# Patient Record
Sex: Male | Born: 1995 | Race: Black or African American | Hispanic: No | Marital: Single | Smoking: Never smoker
Health system: Southern US, Community
[De-identification: ages and names within clinical notes are randomized; demographics above are authoritative.]

## PROBLEM LIST (undated history)

## (undated) DIAGNOSIS — R011 Cardiac murmur, unspecified: Secondary | ICD-10-CM

## (undated) DIAGNOSIS — J45909 Unspecified asthma, uncomplicated: Secondary | ICD-10-CM

---
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
## 2018-05-14 ENCOUNTER — Emergency Department (HOSPITAL_COMMUNITY)
Admission: EM | Admit: 2018-05-14 | Discharge: 2018-05-14 | Disposition: A | Discharge status: Home / Self Care | Payer: Self-pay | Attending: Emergency Medicine | Admitting: Emergency Medicine

## 2018-05-14 ENCOUNTER — Emergency Department (HOSPITAL_COMMUNITY): Payer: Self-pay

## 2018-05-14 DIAGNOSIS — Y9389 Activity, other specified: Secondary | ICD-10-CM | POA: Insufficient documentation

## 2018-05-14 DIAGNOSIS — S82891A Other fracture of right lower leg, initial encounter for closed fracture: Secondary | ICD-10-CM | POA: Insufficient documentation

## 2018-05-14 DIAGNOSIS — Y929 Unspecified place or not applicable: Secondary | ICD-10-CM | POA: Insufficient documentation

## 2018-05-14 DIAGNOSIS — S82831A Other fracture of upper and lower end of right fibula, initial encounter for closed fracture: Secondary | ICD-10-CM | POA: Insufficient documentation

## 2018-05-14 DIAGNOSIS — Y999 Unspecified external cause status: Secondary | ICD-10-CM | POA: Insufficient documentation

## 2018-05-14 MED ORDER — HYDROCODONE-ACETAMINOPHEN 5-325 MG PO TABS: 1.0000 | ORAL_TABLET | ORAL | 0 refills | Status: DC | PRN

## 2018-05-14 MED ORDER — HYDROCODONE-ACETAMINOPHEN 5-325 MG PO TABS
1.0000 | ORAL_TABLET | Freq: Once | ORAL | Status: AC
  Administered 2018-05-14: 1 via ORAL
  Filled 2018-05-14: qty 1
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
## 2018-05-14 NOTE — Discharge Instructions (Signed)
Please return to the Emergency Department for any new or worsening symptoms or if your symptoms do not improve. Please be sure to follow up with your Primary Care Physician as soon as possible regarding your visit today. If you do not have a Primary Doctor please use the resources below to establish one. X-ray today showed a fracture of your proximal fibula as well as possible fracture of your right ankle.  Please use the cam walker to support the area, please use crutches and avoid weightbearing or activities that increase pain to your right lower leg.  Please follow-up with the orthopedic specialist, Dr. Magnus Ivan next week for further evaluation of your fractures. You may use the short course of pain medication as needed.  Do not drive or operate machinery while taking these medications because they will make you drowsy. You may also use ice, rest, elevation to help with your pain.  You may also use over-the-counter anti-inflammatories such as ibuprofen as directed on the packaging.  Contact a health care provider if: Your pain is becoming worse rather than better or is not controlled with medicines. You have increased swelling or redness in the foot. You begin to lose feeling in your foot or toes. Get help right away if: You develop a cold or blue foot or toes on the injured side. You develop severe pain in your injured leg, especially if the pain is increased with movement of your toes. Get help right away if: Your pain is getting worse. The injured area tingles, becomes numb, or turns cold and blue. The part of your body above or below the cast is swollen and discolored. You cannot feel or move your fingers or toes. There is fluid leaking through the cast. You have severe pain or pressure under the cast. You have trouble breathing. You have shortness of breath. You have chest pain.  Do not take your medicine if  develop an itchy rash, swelling in your mouth or lips, or difficulty  breathing.   RESOURCE GUIDE  Chronic Pain Problems: Contact [facility]  [phone] Patients need to be referred by their primary care doctor.  Insufficient Money for Medicine: Contact [employer]:  call "211" or [facility] [phone].  No Primary Care Doctor: Call [facility]  [phone] - can help you locate a primary care doctor that  accepts your insurance, provides certain services, etc. Physician Referral [phone]  Agencies that provide inexpensive medical care: [facility]  [phone] [facility]  [phone] [facility]  [phone] [facility]  [phone] Planned [facility]  [phone] [facility]  [phone]  Medicaid-accepting [facility] Providers: [facility]- [address]  [phone], Mon-Fri 9am-7pm, Sat 9am-1pm [facility]- [address] [place], Suite [place]  [phone] [facility]- [address], [address] [place]  [phone] [facility]- [address]  [phone] Renaye Rakers- [address] [place], [address], [phone]  Only accepts [place] patients after they have their name  applied to their card  Self Pay (no insurance) in [facility]: Sickle Cell Patients: Dr Willey Blade, [facility]  [address] [place], [phone] [facility]- [address] [place]  [phone]       [facility] [place]- [address]       -  [facility]- see information above (Speak to [employer] if you do not have insurance)       -  [facility]- [place], [place],  [place], [place]  Dr Vista Lawman-  [address], [place], [facility]- [address]  [phone]       -  [facility]- [address], [place], also [address] [phone]       -    [facility]- [address], [place], 1st & 3rd Saturday   every month, 10am-1pm  1) Find a Doctor and Pay Out of Pocket Although you won't have to find out who is covered by your insurance plan, it is a good idea to ask around and get recommendations. You will then need to call the office and see if the doctor you have chosen will accept you as a new patient and what types of options they offer for patients who are self-pay. Some doctors offer discounts or will set up payment plans for their patients who do not have insurance, but you will need to ask so you aren't surprised when you get to your appointment.  2) Contact Your Local Health Department Not all health departments have doctors that can see patients for sick visits, but many do, so it is worth a call to see if yours does. If you don't know where your local health department is, you can check in your phone book. The CDC also has a tool to help you locate your state's health department, and many state websites also have listings of all of their local health departments.  3) Find a [place] [facility] If your illness is not likely to be very severe or complicated, you may want to try a walk in clinic. These are popping up all over the country in pharmacies, drugstores, and shopping centers. They're usually staffed by nurse practitioners or physician assistants that have been trained to treat common illnesses and complaints. They're usually fairly quick and inexpensive. However, if you have serious medical issues or chronic medical problems, these are probably not your best option  [facility] [place], [facility], [address], [place], phone [phone] or [phone].  Monday - Friday, call for an appointment. [place], [facility], [address], [place], phone [phone] or [phone].  Monday - Friday, call for an appointment.  Abuse/Neglect: [place] [phone] [place] [phone] (After Hours)  Emergency Shelter:  [facility] [phone]  Maternity Homes: [facility] [place] [phone] [place] [phone]  MRSA Hotline #:   315-552-1754  [facility] Dept. [address]         [place]  Sela Hua Phone:  [phone]                                  Phone:  [phone]                   Phone:  [place], [place]- [phone]       -     [facility] in [place], [address],                                  [phone], [place] [phone] or [phone] (After Hours)   [place]  Substance Abuse Resources: Alcohol and Drug Services  ([phone] [place] [phone] [facility] P[facility] [phone] Residential & Outpatient Substance Abuse Program  4148536109  Psychological Services: [place]  [phone] [place]  [place], [place]. [address] [place] [address] [place], [place]: [phone] or [phone], PicCapture.uy  Dental Assistance  If unable to pay or uninsured, contact:  [facility] or [facility]. to become qualified for the adult dental clinic.  Patients with Medicaid: [facility] [phone] [address], [place] [address] [phone]  If unable to pay, or uninsured, contact [facility] (669)873-6614) or [place] [phone] in [place], [place] in [facility]) to become qualified for the adult dental clinic   Other [facility]- [place], [place], [place], [address], [place], [place], 2nd and 4th Thursday of the month at 6:30am.  10 clients each day by appointment, can sometimes see walk-in patients if someone does not show for an appointment. [facility]- [address] Hillard Danker [place], [place], [address], [place], [place], [place], [address], [place] Department- [phone] [place] [facility] [phone]

## 2018-05-14 NOTE — ED Notes (Signed)
Bed: WTR8 Expected date:  Expected time:  Means of arrival:  Comments: 

## 2018-05-14 NOTE — ED Notes (Signed)
Ortho notified

## 2018-05-20 ENCOUNTER — Ambulatory Visit (INDEPENDENT_AMBULATORY_CARE_PROVIDER_SITE_OTHER): Payer: Self-pay | Admitting: Physician Assistant

## 2018-05-20 ENCOUNTER — Encounter (INDEPENDENT_AMBULATORY_CARE_PROVIDER_SITE_OTHER): Payer: Self-pay | Admitting: Physician Assistant

## 2018-05-20 ENCOUNTER — Ambulatory Visit (INDEPENDENT_AMBULATORY_CARE_PROVIDER_SITE_OTHER): Payer: Self-pay

## 2018-05-20 DIAGNOSIS — M25571 Pain in right ankle and joints of right foot: Secondary | ICD-10-CM

## 2018-05-20 DIAGNOSIS — S93431A Sprain of tibiofibular ligament of right ankle, initial encounter: Secondary | ICD-10-CM

## 2018-05-20 MED ORDER — HYDROCODONE-ACETAMINOPHEN 5-325 MG PO TABS: 1.0000 | ORAL_TABLET | ORAL | 0 refills | Status: AC | PRN
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
## 2018-05-21 ENCOUNTER — Encounter (HOSPITAL_BASED_OUTPATIENT_CLINIC_OR_DEPARTMENT_OTHER): Payer: Self-pay | Admitting: *Deleted

## 2018-05-27 ENCOUNTER — Encounter (HOSPITAL_BASED_OUTPATIENT_CLINIC_OR_DEPARTMENT_OTHER)
Admission: RE | Disposition: A | Discharge status: Home / Self Care | Payer: Self-pay | Source: Ambulatory Visit | Attending: Orthopaedic Surgery

## 2018-05-27 ENCOUNTER — Other Ambulatory Visit: Payer: Self-pay

## 2018-05-27 ENCOUNTER — Ambulatory Visit (HOSPITAL_BASED_OUTPATIENT_CLINIC_OR_DEPARTMENT_OTHER)
Admission: RE | Admit: 2018-05-27 | Discharge: 2018-05-27 | Disposition: A | Discharge status: Home / Self Care | Payer: Self-pay | Source: Ambulatory Visit | Attending: Orthopaedic Surgery | Admitting: Orthopaedic Surgery

## 2018-05-27 ENCOUNTER — Encounter (HOSPITAL_BASED_OUTPATIENT_CLINIC_OR_DEPARTMENT_OTHER): Payer: Self-pay | Admitting: Anesthesiology

## 2018-05-27 ENCOUNTER — Ambulatory Visit (HOSPITAL_BASED_OUTPATIENT_CLINIC_OR_DEPARTMENT_OTHER): Payer: Self-pay | Admitting: Anesthesiology

## 2018-05-27 ENCOUNTER — Ambulatory Visit (HOSPITAL_COMMUNITY): Payer: Self-pay

## 2018-05-27 DIAGNOSIS — S93431A Sprain of tibiofibular ligament of right ankle, initial encounter: Secondary | ICD-10-CM | POA: Insufficient documentation

## 2018-05-27 DIAGNOSIS — Z419 Encounter for procedure for purposes other than remedying health state, unspecified: Secondary | ICD-10-CM

## 2018-05-27 DIAGNOSIS — Y929 Unspecified place or not applicable: Secondary | ICD-10-CM | POA: Insufficient documentation

## 2018-05-27 DIAGNOSIS — S82861A Displaced Maisonneuve's fracture of right leg, initial encounter for closed fracture: Secondary | ICD-10-CM | POA: Diagnosis present

## 2018-05-27 DIAGNOSIS — S82821A Torus fracture of lower end of right fibula, initial encounter for closed fracture: Secondary | ICD-10-CM

## 2018-05-27 DIAGNOSIS — Z880 Allergy status to penicillin: Secondary | ICD-10-CM | POA: Insufficient documentation

## 2018-05-27 DIAGNOSIS — X58XXXA Exposure to other specified factors, initial encounter: Secondary | ICD-10-CM | POA: Insufficient documentation

## 2018-05-27 HISTORY — DX: Cardiac murmur, unspecified: R01.1

## 2018-05-27 HISTORY — PX: ORIF ANKLE FRACTURE: SHX5408

## 2018-05-27 SURGERY — OPEN REDUCTION INTERNAL FIXATION (ORIF) ANKLE FRACTURE
Anesthesia: General | Site: Ankle | Laterality: Right

## 2018-05-27 MED ORDER — BUPIVACAINE HCL (PF) 0.25 % IJ SOLN
INTRAMUSCULAR | Status: AC
  Filled 2018-05-27: qty 30

## 2018-05-27 MED ORDER — LIDOCAINE 2% (20 MG/ML) 5 ML SYRINGE
INTRAMUSCULAR | Status: DC | PRN
  Administered 2018-05-27: 100 mg via INTRAVENOUS

## 2018-05-27 MED ORDER — OXYCODONE HCL 5 MG PO TABS: 5.0000 mg | ORAL_TABLET | Freq: Once | ORAL | Status: DC | PRN

## 2018-05-27 MED ORDER — CLONIDINE HCL (ANALGESIA) 100 MCG/ML EP SOLN
EPIDURAL | Status: DC | PRN
  Administered 2018-05-27: 30 ug
  Administered 2018-05-27: 50 ug

## 2018-05-27 MED ORDER — BUPIVACAINE HCL (PF) 0.5 % IJ SOLN
INTRAMUSCULAR | Status: AC
  Filled 2018-05-27: qty 60

## 2018-05-27 MED ORDER — LACTATED RINGERS IV SOLN
INTRAVENOUS | Status: DC
  Administered 2018-05-27: 08:00:00 via INTRAVENOUS

## 2018-05-27 MED ORDER — LIDOCAINE 2% (20 MG/ML) 5 ML SYRINGE
INTRAMUSCULAR | Status: AC
  Filled 2018-05-27: qty 5

## 2018-05-27 MED ORDER — CLINDAMYCIN PHOSPHATE 900 MG/50ML IV SOLN
INTRAVENOUS | Status: AC
  Filled 2018-05-27: qty 50

## 2018-05-27 MED ORDER — CHLORHEXIDINE GLUCONATE 4 % EX LIQD: 60.0000 mL | Freq: Once | CUTANEOUS | Status: DC

## 2018-05-27 MED ORDER — CLINDAMYCIN PHOSPHATE 900 MG/50ML IV SOLN
900.0000 mg | INTRAVENOUS | Status: AC
  Administered 2018-05-27: 900 mg via INTRAVENOUS

## 2018-05-27 MED ORDER — PROPOFOL 10 MG/ML IV BOLUS
INTRAVENOUS | Status: DC | PRN
  Administered 2018-05-27: 200 mg via INTRAVENOUS

## 2018-05-27 MED ORDER — MIDAZOLAM HCL 2 MG/2ML IJ SOLN
INTRAMUSCULAR | Status: AC
  Filled 2018-05-27: qty 2

## 2018-05-27 MED ORDER — LIDOCAINE HCL (PF) 1 % IJ SOLN
INTRAMUSCULAR | Status: AC
  Filled 2018-05-27: qty 60

## 2018-05-27 MED ORDER — OXYCODONE-ACETAMINOPHEN 5-325 MG PO TABS: 1.0000 | ORAL_TABLET | ORAL | 0 refills | Status: AC | PRN

## 2018-05-27 MED ORDER — LACTATED RINGERS IV SOLN: INTRAVENOUS | Status: DC

## 2018-05-27 MED ORDER — PROPOFOL 500 MG/50ML IV EMUL
INTRAVENOUS | Status: AC
  Filled 2018-05-27: qty 50

## 2018-05-27 MED ORDER — ONDANSETRON HCL 4 MG PO TABS: 4.0000 mg | ORAL_TABLET | Freq: Three times a day (TID) | ORAL | 0 refills | Status: AC | PRN

## 2018-05-27 MED ORDER — PROMETHAZINE HCL 25 MG PO TABS: 25.0000 mg | ORAL_TABLET | Freq: Four times a day (QID) | ORAL | 1 refills | Status: AC | PRN

## 2018-05-27 MED ORDER — MIDAZOLAM HCL 2 MG/2ML IJ SOLN
1.0000 mg | INTRAMUSCULAR | Status: DC | PRN
  Administered 2018-05-27: 2 mg via INTRAVENOUS

## 2018-05-27 MED ORDER — FENTANYL CITRATE (PF) 100 MCG/2ML IJ SOLN: 25.0000 ug | INTRAMUSCULAR | Status: DC | PRN

## 2018-05-27 MED ORDER — METHOCARBAMOL 750 MG PO TABS: 750.0000 mg | ORAL_TABLET | Freq: Two times a day (BID) | ORAL | 0 refills | Status: AC | PRN

## 2018-05-27 MED ORDER — MEPERIDINE HCL 25 MG/ML IJ SOLN: 6.2500 mg | INTRAMUSCULAR | Status: DC | PRN

## 2018-05-27 MED ORDER — SCOPOLAMINE 1 MG/3DAYS TD PT72: 1.0000 | MEDICATED_PATCH | Freq: Once | TRANSDERMAL | Status: DC | PRN

## 2018-05-27 MED ORDER — DEXAMETHASONE SODIUM PHOSPHATE 4 MG/ML IJ SOLN
INTRAMUSCULAR | Status: DC | PRN
  Administered 2018-05-27: 10 mg via INTRAVENOUS

## 2018-05-27 MED ORDER — BUPIVACAINE HCL (PF) 0.5 % IJ SOLN
INTRAMUSCULAR | Status: DC | PRN
  Administered 2018-05-27: 25 mL via PERINEURAL
  Administered 2018-05-27: 15 mL via PERINEURAL

## 2018-05-27 MED ORDER — SENNOSIDES-DOCUSATE SODIUM 8.6-50 MG PO TABS: 1.0000 | ORAL_TABLET | Freq: Every evening | ORAL | 1 refills | Status: AC | PRN

## 2018-05-27 MED ORDER — OXYCODONE HCL 5 MG/5ML PO SOLN: 5.0000 mg | Freq: Once | ORAL | Status: DC | PRN

## 2018-05-27 MED ORDER — FENTANYL CITRATE (PF) 100 MCG/2ML IJ SOLN
INTRAMUSCULAR | Status: AC
  Filled 2018-05-27: qty 2

## 2018-05-27 MED ORDER — ONDANSETRON HCL 4 MG/2ML IJ SOLN
INTRAMUSCULAR | Status: DC | PRN
  Administered 2018-05-27: 4 mg via INTRAVENOUS

## 2018-05-27 MED ORDER — PROMETHAZINE HCL 25 MG/ML IJ SOLN: 6.2500 mg | INTRAMUSCULAR | Status: DC | PRN

## 2018-05-27 MED ORDER — DEXAMETHASONE SODIUM PHOSPHATE 10 MG/ML IJ SOLN
INTRAMUSCULAR | Status: AC
  Filled 2018-05-27: qty 1

## 2018-05-27 MED ORDER — ASPIRIN EC 81 MG PO TBEC: 81.0000 mg | DELAYED_RELEASE_TABLET | Freq: Two times a day (BID) | ORAL | 0 refills | Status: AC

## 2018-05-27 MED ORDER — FENTANYL CITRATE (PF) 100 MCG/2ML IJ SOLN
50.0000 ug | INTRAMUSCULAR | Status: DC | PRN
  Administered 2018-05-27: 100 ug via INTRAVENOUS
  Administered 2018-05-27: 25 ug via INTRAVENOUS

## 2018-05-27 MED ORDER — ONDANSETRON HCL 4 MG/2ML IJ SOLN
INTRAMUSCULAR | Status: AC
  Filled 2018-05-27: qty 2

## 2018-05-27 SURGICAL SUPPLY — 79 items
BANDAGE ACE 4X5 VEL STRL LF (GAUZE/BANDAGES/DRESSINGS) IMPLANT
BANDAGE ACE 6X5 VEL STRL LF (GAUZE/BANDAGES/DRESSINGS) ×3 IMPLANT
BANDAGE ESMARK 6X9 LF (GAUZE/BANDAGES/DRESSINGS) ×1 IMPLANT
BLADE HEX COATED 2.75 (ELECTRODE) ×3 IMPLANT
BLADE SURG 15 STRL LF DISP TIS (BLADE) ×2 IMPLANT
BLADE SURG 15 STRL SS (BLADE) ×4
BNDG COHESIVE 6X5 TAN STRL LF (GAUZE/BANDAGES/DRESSINGS) ×3 IMPLANT
BNDG ESMARK 6X9 LF (GAUZE/BANDAGES/DRESSINGS) ×3
BRUSH SCRUB EZ PLAIN DRY (MISCELLANEOUS) ×3 IMPLANT
CANISTER SUCT 1200ML W/VALVE (MISCELLANEOUS) ×3 IMPLANT
COVER BACK TABLE 60X90IN (DRAPES) ×3 IMPLANT
COVER MAYO STAND STRL (DRAPES) ×3 IMPLANT
COVER WAND RF STERILE (DRAPES) IMPLANT
CUFF TOURNIQUET SINGLE 34IN LL (TOURNIQUET CUFF) ×3 IMPLANT
DECANTER SPIKE VIAL GLASS SM (MISCELLANEOUS) IMPLANT
DRAPE C-ARM 42X72 X-RAY (DRAPES) ×6 IMPLANT
DRAPE C-ARMOR (DRAPES) ×3 IMPLANT
DRAPE EXTREMITY T 121X128X90 (DRAPE) ×3 IMPLANT
DRAPE IMP U-DRAPE 54X76 (DRAPES) ×3 IMPLANT
DRAPE SURG 17X23 STRL (DRAPES) ×6 IMPLANT
DRSG PAD ABDOMINAL 8X10 ST (GAUZE/BANDAGES/DRESSINGS) ×6 IMPLANT
DURAPREP 26ML APPLICATOR (WOUND CARE) ×6 IMPLANT
ELECT REM PT RETURN 9FT ADLT (ELECTROSURGICAL) ×3
ELECTRODE REM PT RTRN 9FT ADLT (ELECTROSURGICAL) ×1 IMPLANT
GAUZE SPONGE 4X4 12PLY STRL (GAUZE/BANDAGES/DRESSINGS) ×3 IMPLANT
GAUZE XEROFORM 1X8 LF (GAUZE/BANDAGES/DRESSINGS) ×3 IMPLANT
GLOVE BIOGEL M 7.0 STRL (GLOVE) ×3 IMPLANT
GLOVE BIOGEL PI IND STRL 7.0 (GLOVE) ×1 IMPLANT
GLOVE BIOGEL PI IND STRL 8 (GLOVE) ×2 IMPLANT
GLOVE BIOGEL PI INDICATOR 7.0 (GLOVE) ×2
GLOVE BIOGEL PI INDICATOR 8 (GLOVE) ×4
GLOVE ECLIPSE 7.0 STRL STRAW (GLOVE) ×3 IMPLANT
GLOVE SKINSENSE NS SZ7.5 (GLOVE) ×2
GLOVE SKINSENSE STRL SZ7.5 (GLOVE) ×1 IMPLANT
GLOVE SURG SYN 7.5  E (GLOVE) ×2
GLOVE SURG SYN 7.5 E (GLOVE) ×1 IMPLANT
GOWN STRL REIN XL XLG (GOWN DISPOSABLE) ×3 IMPLANT
GOWN STRL REUS W/ TWL LRG LVL3 (GOWN DISPOSABLE) ×1 IMPLANT
GOWN STRL REUS W/ TWL XL LVL3 (GOWN DISPOSABLE) ×1 IMPLANT
GOWN STRL REUS W/TWL LRG LVL3 (GOWN DISPOSABLE) ×2
GOWN STRL REUS W/TWL XL LVL3 (GOWN DISPOSABLE) ×2
KIT INVISIKNOT ANKLE FRACTURE (Screw) ×6 IMPLANT
MANIFOLD NEPTUNE II (INSTRUMENTS) ×3 IMPLANT
NEEDLE HYPO 22GX1.5 SAFETY (NEEDLE) IMPLANT
NS IRRIG 1000ML POUR BTL (IV SOLUTION) ×3 IMPLANT
PACK BASIN DAY SURGERY FS (CUSTOM PROCEDURE TRAY) ×3 IMPLANT
PAD CAST 3X4 CTTN HI CHSV (CAST SUPPLIES) IMPLANT
PAD CAST 4YDX4 CTTN HI CHSV (CAST SUPPLIES) IMPLANT
PADDING CAST COTTON 3X4 STRL (CAST SUPPLIES)
PADDING CAST COTTON 4X4 STRL (CAST SUPPLIES)
PADDING CAST COTTON 6X4 STRL (CAST SUPPLIES) ×3 IMPLANT
PADDING CAST SYN 6 (CAST SUPPLIES) ×2
PADDING CAST SYNTHETIC 4 (CAST SUPPLIES) ×2
PADDING CAST SYNTHETIC 4X4 STR (CAST SUPPLIES) ×1 IMPLANT
PADDING CAST SYNTHETIC 6X4 NS (CAST SUPPLIES) ×1 IMPLANT
PENCIL BUTTON HOLSTER BLD 10FT (ELECTRODE) ×3 IMPLANT
PLATE 2H 1/3 TUB 3.5X22 (Plate) ×3 IMPLANT
SHEET MEDIUM DRAPE 40X70 STRL (DRAPES) ×3 IMPLANT
SLEEVE SCD COMPRESS KNEE MED (MISCELLANEOUS) ×3 IMPLANT
SPLINT FIBERGLASS 4X30 (CAST SUPPLIES) IMPLANT
SPONGE LAP 18X18 RF (DISPOSABLE) ×3 IMPLANT
SUCTION FRAZIER HANDLE 10FR (MISCELLANEOUS) ×2
SUCTION TUBE FRAZIER 10FR DISP (MISCELLANEOUS) ×1 IMPLANT
SUT ETHILON 3 0 PS 1 (SUTURE) IMPLANT
SUT VIC AB 0 CT1 27 (SUTURE)
SUT VIC AB 0 CT1 27XBRD ANBCTR (SUTURE) IMPLANT
SUT VIC AB 2-0 CT1 27 (SUTURE) ×2
SUT VIC AB 2-0 CT1 TAPERPNT 27 (SUTURE) ×1 IMPLANT
SUT VIC AB 3-0 SH 27 (SUTURE)
SUT VIC AB 3-0 SH 27X BRD (SUTURE) IMPLANT
SYR BULB 3OZ (MISCELLANEOUS) ×3 IMPLANT
SYR CONTROL 10ML LL (SYRINGE) IMPLANT
TOWEL GREEN STERILE FF (TOWEL DISPOSABLE) ×3 IMPLANT
TOWEL OR NON WOVEN STRL DISP B (DISPOSABLE) ×3 IMPLANT
TRAY DSU PREP LF (CUSTOM PROCEDURE TRAY) ×3 IMPLANT
TUBE CONNECTING 20'X1/4 (TUBING) ×1
TUBE CONNECTING 20X1/4 (TUBING) ×2 IMPLANT
UNDERPAD 30X30 (UNDERPADS AND DIAPERS) ×3 IMPLANT
YANKAUER SUCT BULB TIP NO VENT (SUCTIONS) ×3 IMPLANT

## 2018-05-27 NOTE — Anesthesia Procedure Notes (Signed)
Anesthesia Regional Block: Adductor canal block   Pre-Anesthetic Checklist: ,, timeout performed, Correct Patient, Correct Site, Correct Laterality, Correct Procedure, Correct Position, site marked, Risks and benefits discussed,  Surgical consent,  Pre-op evaluation,  At surgeon's request and post-op pain management  Laterality: Right  Prep: chloraprep       Needles:  Injection technique: Single-shot  Needle Type: Stimiplex     Needle Length: 9cm  Needle Gauge: 21     Additional Needles:   Procedures:,,,, ultrasound used (permanent image in chart),,,,  Narrative:  Start time: 05/27/2018 8:26 AM End time: 05/27/2018 8:28 AM Injection made incrementally with aspirations every 5 mL.  Performed by: Personally  Anesthesiologist: Lewie LoronGermeroth, Maxyne Derocher, MD  Additional Notes: BP cuff, EKG monitors applied. Sedation begun. Artery and nerve location verified with U/S and anesthetic injected incrementally, slowly, and after negative aspirations under direct u/s guidance. Good fascial /perineural spread. Tolerated well.

## 2018-05-27 NOTE — Progress Notes (Signed)
Assisted Dr. Germeroth with right, ultrasound guided, popliteal/saphenous block. Side rails up, monitors on throughout procedure. See vital signs in flow sheet. Tolerated Procedure well. 

## 2018-05-27 NOTE — Transfer of Care (Signed)
Immediate Anesthesia Transfer of Care Note  Patient: Caleb Matthews.  Procedure(s) Performed: OPEN REDUCTION INTERNAL FIXATION (ORIF) RIGHT ANKLE SYNDESMOTIC DISRUPTION (Right Ankle)  Patient Location: PACU  Anesthesia Type:GA combined with regional for post-op pain  Level of Consciousness: responds to stimulation  Airway & Oxygen Therapy: Patient Spontanous Breathing and Patient connected to face mask oxygen  Post-op Assessment: Report given to RN and Post -op Vital signs reviewed and stable  Post vital signs: Reviewed and stable  Last Vitals:  Vitals Value Taken Time  BP    Temp    Pulse 53 05/27/2018  9:43 AM  Resp    SpO2 100 % 05/27/2018  9:43 AM  Vitals shown include unvalidated device data.  Last Pain:  Vitals:   05/27/18 0833  TempSrc:   PainSc: 0-No pain      Patients Stated Pain Goal: 0 (05/27/18 [id number])  Complications: No apparent anesthesia complications

## 2018-05-27 NOTE — Anesthesia Procedure Notes (Signed)
Procedure Name: LMA Insertion Date/Time: 05/27/2018 8:44 AM Performed by: Gar GibbonKeeton, Miner Koral S, CRNA Pre-anesthesia Checklist: Patient identified, Emergency Drugs available, Suction available and Patient being monitored Patient Re-evaluated:Patient Re-evaluated prior to induction Oxygen Delivery Method: Circle system utilized Preoxygenation: Pre-oxygenation with 100% oxygen Induction Type: IV induction Ventilation: Mask ventilation without difficulty LMA: LMA inserted LMA Size: 4.0 Number of attempts: 1 Airway Equipment and Method: Bite block Placement Confirmation: positive ETCO2 Tube secured with: Tape Dental Injury: Teeth and Oropharynx as per pre-operative assessment

## 2018-05-27 NOTE — H&P (Signed)
    PREOPERATIVE H&P  Chief Complaint: right ankle syndesmotic injury  HPI: Caleb BaptistCraig Ricardo Madonia Jr. is a 22 y.o. male who presents for surgical treatment of right ankle syndesmotic injury.  He denies any changes in medical history.  Past Medical History:  Diagnosis Date  . Heart murmur    Past Surgical History:  Procedure Laterality Date  . FEMUR SURGERY Left 12/2015  . WISDOM TOOTH EXTRACTION Bilateral 2016   Social History   Socioeconomic History  . Marital status: Single    Spouse name: Not on file  . Number of children: Not on file  . Years of education: Not on file  . Highest education level: Not on file  Occupational History  . Not on file  Social Needs  . Financial resource strain: Not on file  . Food insecurity:    Worry: Not on file    Inability: Not on file  . Transportation needs:    Medical: Not on file    Non-medical: Not on file  Tobacco Use  . Smoking status: Never Smoker  . Smokeless tobacco: Never Used  Substance and Sexual Activity  . Alcohol use: Not on file  . Drug use: Not on file  . Sexual activity: Not on file  Lifestyle  . Physical activity:    Days per week: Not on file    Minutes per session: Not on file  . Stress: Not on file  Relationships  . Social connections:    Talks on phone: Not on file    Gets together: Not on file    Attends religious service: Not on file    Active member of club or organization: Not on file    Attends meetings of clubs or organizations: Not on file    Relationship status: Not on file  Other Topics Concern  . Not on file  Social History Narrative  . Not on file   History reviewed. No pertinent family history. Allergies  Allergen Reactions  . Peanut Butter Flavor Shortness Of Breath and Swelling  . Penicillins    Prior to Admission medications   Medication Sig Start Date End Date Taking? Authorizing Provider  HYDROcodone-acetaminophen (NORCO/VICODIN) 5-325 MG tablet Take 1 tablet by mouth every 4  (four) hours as needed. 05/20/18   Kirtland Bouchardlark, Gilbert W, PA-C     Positive ROS: All other systems have been reviewed and were otherwise negative with the exception of those mentioned in the HPI and as above.  Physical Exam: General: Alert, no acute distress Cardiovascular: No pedal edema Respiratory: No cyanosis, no use of accessory musculature GI: abdomen soft Skin: No lesions in the area of chief complaint Neurologic: Sensation intact distally Psychiatric: Patient is competent for consent with normal mood and affect Lymphatic: no lymphedema  MUSCULOSKELETAL: exam stable  Assessment: right ankle syndesmotic injury  Plan: Plan for Procedure(s): OPEN REDUCTION INTERNAL FIXATION (ORIF) RIGHT ANKLE SYNDESMOTIC DISRUPTION  The risks benefits and alternatives were discussed with the patient including but not limited to the risks of nonoperative treatment, versus surgical intervention including infection, bleeding, nerve injury,  blood clots, cardiopulmonary complications, morbidity, mortality, among others, and they were willing to proceed.   Glee ArvinMichael Latessa Tillis, MD   05/27/2018 7:42 AM

## 2018-05-27 NOTE — Op Note (Signed)
   Date of Surgery: 05/27/2018  INDICATIONS: Caleb Matthews is a 22 y.o.-year-old male who sustained a right maisonneuve ankle injury; he was indicated for surgical fixation. The patient did consent to the procedure after discussion of the risks and benefits.  PREOPERATIVE DIAGNOSIS: right maisonneuve ankle injury  POSTOPERATIVE DIAGNOSIS: Same.  PROCEDURE:  1. Open treatment of right ankle syndesmosis rupture 2. Closed treatment and manipulation of right fibula shaft fracture  SURGEON: N. Glee ArvinMichael , M.D.  ASSIST: Starlyn SkeansMary Lindsey Mantor[place]Stanbery, [place]; necessary for the timely completion of procedure and due to complexity of procedure.  ANESTHESIA:  general, regional  TOURNIQUET TIME: less than 30 mins  IV FLUIDS AND URINE: See anesthesia.  ESTIMATED BLOOD LOSS: minimal mL.  IMPLANTS: [employer] 2 hole semitubular plate, 2 invisi-knot tightrope construct  COMPLICATIONS: None.  DESCRIPTION OF PROCEDURE: The patient was brought to the operating room and placed supine on the operating table.  The patient had been signed prior to the procedure and this was documented. The patient had the anesthesia placed by the anesthesiologist.  A nonsterile tourniquet was placed on the upper thigh.  The prep verification and incision time-outs were performed to confirm that this was the correct patient, site, side and location. The patient had an SCD on the opposite lower extremity. The patient did receive antibiotics prior to the incision and was re-dosed during the procedure as needed at indicated intervals.  The patient had the lower extremity prepped and draped in the standard surgical fashion.  The extremity was exsanguinated using an esmarch bandage and the tourniquet was inflated to 300 mm Hg.  An incision was made over the lateral aspect of the distal fibula.  Full-thickness flaps were elevated.  Subperiosteal elevation was performed.  The fibula was exposed.  I then performed manipulation of the ankle in  order to reduce both the syndesmosis and the fibular shaft fracture.  With this produced I then applied a large King tong periarticular clamp in order to hold the reduction with a 2 hole semitubular plate applied to the lateral aspect of the distal fibula.  Fluoroscopic guidance was used to confirm appropriate reduction and placement of the plate.  I then placed 2 parallel invisi-knot tightropes across the ankle parallel to the joint.  This was achieved by first drilling with the provided drill bit which was then delivered out the medial side of the ankle.  The sutures was then delivered out medially.  The button was flipped in the subcutaneous tissue and confirmed under fluoroscopy.  The tight rope was then cinched down laterally in order to keep the syndesmosis reduced.  The same steps were performed for the second tight rope.  Final x-rays were then taken.  The King tong clamp was removed.  Surgical wounds were thoroughly irrigated and closed in layered fashion using 2-0 Vicryl and 3-0 nylon.  Sterile dressings were applied.  Foot was immobilized in a short leg splint.  Patient tolerated procedure well and had no immediate complications.  POSTOPERATIVE PLAN: Caleb Matthews will remain nonweightbearing on this leg for approximately 6 weeks; Caleb Matthews will return for suture removal in 2 weeks.  He will be immobilized in a short leg splint and then transitioned to a CAM walker at his first follow up appointment.  Caleb Matthews will receive DVT prophylaxis based on other medications, activity level, and risk ratio of bleeding to thrombosis.  Mayra ReelN. Michael , MD [facility] [phone] 9:23 AM

## 2018-05-27 NOTE — Discharge Instructions (Signed)
    1. Keep splint clean and dry 2. Elevate foot above level of the heart 3. Take aspirin to prevent blood clots 4. Take pain meds as needed 5. Strict non weight bearing to operative extremity   Post Anesthesia Home Care Instructions  Activity: Get plenty of rest for the remainder of the day. A responsible individual must stay with you for 24 hours following the procedure.  For the next 24 hours, DO NOT: -Drive a car -Operate machinery -Drink alcoholic beverages -Take any medication unless instructed by your physician -Make any legal decisions or sign important papers.  Meals: Start with liquid foods such as gelatin or soup. Progress to regular foods as tolerated. Avoid greasy, spicy, heavy foods. If nausea and/or vomiting occur, drink only clear liquids until the nausea and/or vomiting subsides. Call your physician if vomiting continues.  Special Instructions/Symptoms: Your throat may feel dry or sore from the anesthesia or the breathing tube placed in your throat during surgery. If this causes discomfort, gargle with warm salt water. The discomfort should disappear within 24 hours.  If you had a scopolamine patch placed behind your ear for the management of post- operative nausea and/or vomiting:  1. The medication in the patch is effective for 72 hours, after which it should be removed.  Wrap patch in a tissue and discard in the trash. Wash hands thoroughly with soap and water. 2. You may remove the patch earlier than 72 hours if you experience unpleasant side effects which may include dry mouth, dizziness or visual disturbances. 3. Avoid touching the patch. Wash your hands with soap and water after contact with the patch.  Regional Anesthesia Blocks  1. Numbness or the inability to move the "blocked" extremity may last from 3-48 hours after placement. The length of time depends on the medication injected and your individual response to the medication. If the numbness is not  going away after 48 hours, call your surgeon.  2. The extremity that is blocked will need to be protected until the numbness is gone and the  Strength has returned. Because you cannot feel it, you will need to take extra care to avoid injury. Because it may be weak, you may have difficulty moving it or using it. You may not know what position it is in without looking at it while the block is in effect.  3. For blocks in the legs and feet, returning to weight bearing and walking needs to be done carefully. You will need to wait until the numbness is entirely gone and the strength has returned. You should be able to move your leg and foot normally before you try and bear weight or walk. You will need someone to be with you when you first try to ensure you do not fall and possibly risk injury.  4. Bruising and tenderness at the needle site are common side effects and will resolve in a few days.  5. Persistent numbness or new problems with movement should be communicated to the surgeon or the [facility] ([phone])/ [facility] ([phone]).      

## 2018-05-27 NOTE — Anesthesia Procedure Notes (Signed)
Anesthesia Regional Block: Popliteal block   Pre-Anesthetic Checklist: ,, timeout performed, Correct Patient, Correct Site, Correct Laterality, Correct Procedure, Correct Position, site marked, Risks and benefits discussed,  Surgical consent,  Pre-op evaluation,  At surgeon's request and post-op pain management  Laterality: Right  Prep: chloraprep       Needles:  Injection technique: Single-shot  Needle Type: Stimiplex     Needle Length: 10cm  Needle Gauge: 21     Additional Needles:   Procedures:,,,, ultrasound used (permanent image in chart),,,,  Motor weakness within 5 minutes.  Narrative:  Start time: 05/27/2018 8:28 AM End time: 05/27/2018 8:31 AM Injection made incrementally with aspirations every 5 mL.  Performed by: Personally  Anesthesiologist: Lewie LoronGermeroth, Shauntae Reitman, MD  Additional Notes: Nerve located and needle positioned with direct ultrasound guidance. Good perineural spread. Patient tolerated well.

## 2018-05-27 NOTE — Anesthesia Preprocedure Evaluation (Addendum)
Anesthesia Evaluation  Patient identified by MRN, date of birth, ID band Patient awake    Reviewed: Allergy & Precautions, NPO status , Patient's Chart, lab work & pertinent test results  Airway Mallampati: I  TM Distance: >3 FB Neck ROM: Full    Dental no notable dental hx. (+) Dental Advisory Given, Teeth Intact   Pulmonary neg pulmonary ROS,    Pulmonary exam normal breath sounds clear to auscultation       Cardiovascular Normal cardiovascular exam+ Valvular Problems/Murmurs  Rhythm:Regular Rate:Normal     Neuro/Psych negative neurological ROS  negative psych ROS   GI/Hepatic negative GI ROS, Neg liver ROS,   Endo/Other  negative endocrine ROS  Renal/GU negative Renal ROS     Musculoskeletal negative musculoskeletal ROS (+)   Abdominal   Peds  Hematology negative hematology ROS (+)   Anesthesia Other Findings   Reproductive/Obstetrics                             Anesthesia Physical Anesthesia Plan  ASA: I  Anesthesia Plan: General   Post-op Pain Management: GA combined w/ Regional for post-op pain   Induction: Intravenous  PONV Risk Score and Plan: 3 and Ondansetron, Dexamethasone, Midazolam and Treatment may vary due to age or medical condition  Airway Management Planned:   Additional Equipment: None  Intra-op Plan:   Post-operative Plan: Extubation in OR  Informed Consent: I have reviewed the patients History and Physical, chart, labs and discussed the procedure including the risks, benefits and alternatives for the proposed anesthesia with the patient or authorized representative who has indicated his/her understanding and acceptance.   Dental advisory given  Plan Discussed with: CRNA  Anesthesia Plan Comments: (Ready to block patient at 0757. Preop questions still being asked after 0820. Dr. Roda ShuttersXu in room at [phone]. Pt not hooked up to monitor until 0825. Delay to OR due  to preop.)      Anesthesia Quick Evaluation

## 2018-05-28 ENCOUNTER — Telehealth (INDEPENDENT_AMBULATORY_CARE_PROVIDER_SITE_OTHER): Payer: Self-pay | Admitting: Orthopaedic Surgery

## 2018-05-28 NOTE — Telephone Encounter (Signed)
He should take either advil 800 mg tid for naprosyn 500 mg bid.  Keep it elevated and ice around the clock.  The surgical pain will improve in a day or two.

## 2018-05-28 NOTE — Telephone Encounter (Signed)
Patient's mother called stating the Percocet is not helping her son at all.  She stated that he is in worse pain than he was before the surgery and wanted to know if there was something else that he could get.  CB#[phone].  Thank you

## 2018-05-28 NOTE — Telephone Encounter (Signed)
Please advise 

## 2018-05-29 ENCOUNTER — Other Ambulatory Visit (INDEPENDENT_AMBULATORY_CARE_PROVIDER_SITE_OTHER): Payer: Self-pay

## 2018-05-29 ENCOUNTER — Encounter (HOSPITAL_BASED_OUTPATIENT_CLINIC_OR_DEPARTMENT_OTHER): Payer: Self-pay | Admitting: Orthopaedic Surgery

## 2018-05-29 MED ORDER — NAPROXEN 500 MG PO TABS: 500.0000 mg | ORAL_TABLET | Freq: Two times a day (BID) | ORAL | 1 refills | Status: AC

## 2018-05-29 NOTE — Anesthesia Postprocedure Evaluation (Signed)
Anesthesia Post Note  Patient: Caleb BaptistCraig Ricardo Pinegar Jr.  Procedure(s) Performed: OPEN REDUCTION INTERNAL FIXATION (ORIF) RIGHT ANKLE SYNDESMOTIC DISRUPTION (Right Ankle)     Patient location during evaluation: PACU Anesthesia Type: General Level of consciousness: sedated and patient cooperative Pain management: pain level controlled Vital Signs Assessment: post-procedure vital signs reviewed and stable Respiratory status: spontaneous breathing Cardiovascular status: stable Anesthetic complications: no    Last Vitals:  Vitals:   05/27/18 0944 05/27/18 1050  BP:  127/89  Pulse: (!) 53 (!) 55  Resp: 13 18  Temp:  36.7 C  SpO2: 100% 100%    Last Pain:  Vitals:   05/28/18 0904  TempSrc:   PainSc: 1                  Lewie LoronJohn Edy Belt

## 2018-05-29 NOTE — Telephone Encounter (Signed)
Called into pharmacy, patient mom aware

## 2018-06-09 ENCOUNTER — Ambulatory Visit (INDEPENDENT_AMBULATORY_CARE_PROVIDER_SITE_OTHER): Payer: Self-pay

## 2018-06-09 ENCOUNTER — Ambulatory Visit (INDEPENDENT_AMBULATORY_CARE_PROVIDER_SITE_OTHER): Payer: Self-pay | Admitting: Physician Assistant

## 2018-06-09 ENCOUNTER — Encounter (INDEPENDENT_AMBULATORY_CARE_PROVIDER_SITE_OTHER): Payer: Self-pay | Admitting: Orthopaedic Surgery

## 2018-06-09 DIAGNOSIS — M25571 Pain in right ankle and joints of right foot: Secondary | ICD-10-CM

## 2018-06-09 NOTE — Progress Notes (Signed)
   Post-Op Visit Note   Patient: Caleb BaptistCraig Ricardo Fuhs Jr.           Date of Birth: 01/02/1996           MRN: [id number] Visit Date: 06/09/2018 PCP: Patient, No Pcp Per   Assessment & Plan:  Chief Complaint:  Chief Complaint  Patient presents with  . Right Ankle - Routine Post Op, Follow-up, Pain   Visit Diagnoses:  1. Pain in right ankle and joints of right foot     Plan: Patient is a pleasant 22 year old gentleman who presents to clinic today 13 days status post ORIF right ankle syndesmotic injury, date of surgery 05/27/2018.  He has been compliant nonweightbearing in a short leg splint.  He has been elevating for swelling.  He has not been taking any of his pain medicine over the past week.  No fevers or chills.  Overall doing well.  Examination of the right ankle reveals well-healing surgical incisions with nylon sutures in place.  He does have mild to moderate swelling.  Calf is soft and nontender.  He is neurovascularly intact distally.  At this point, nylon sutures were removed and Steri-Strips applied.  We will transition the patient back into his fracture boot for another 4 weeks nonweightbearing.  He has one at home so will not need to get one from our office today.  He will continue to elevate for swelling.  He will come out of the boot for gentle range of motion exercises.  Follow-up with us in 4 weeks time for repeat evaluation and x-ray.  Follow-Up Instructions: Return in about 4 weeks (around 07/07/2018).   Orders:  Orders Placed This Encounter  Procedures  . XR Ankle Complete Right   No orders of the defined types were placed in this encounter.   Imaging: Xr Ankle Complete Right  Result Date: 06/09/2018 Stable alignment of the tight rope and no widening of the mortise   PMFS History: Patient Active Problem List   Diagnosis Date Noted  . Closed displaced Maisonneuve fracture, right, initial encounter 05/27/2018   Past Medical History:  Diagnosis Date  . Heart  murmur     History reviewed. No pertinent family history.  Past Surgical History:  Procedure Laterality Date  . FEMUR SURGERY Left 12/2015  . ORIF ANKLE FRACTURE Right 05/27/2018   Procedure: OPEN REDUCTION INTERNAL FIXATION (ORIF) RIGHT ANKLE SYNDESMOTIC DISRUPTION;  Surgeon: Tarry KosXu, Naiping M, MD;  Location: [facility];  Service: Orthopedics;  Laterality: Right;  . WISDOM TOOTH EXTRACTION Bilateral 2016   Social History   Occupational History  . Not on file  Tobacco Use  . Smoking status: Never Smoker  . Smokeless tobacco: Never Used  Substance and Sexual Activity  . Alcohol use: Not on file  . Drug use: Not on file  . Sexual activity: Not on file

## 2018-07-03 ENCOUNTER — Ambulatory Visit (INDEPENDENT_AMBULATORY_CARE_PROVIDER_SITE_OTHER): Payer: Self-pay | Admitting: Orthopaedic Surgery

## 2018-07-03 ENCOUNTER — Encounter (INDEPENDENT_AMBULATORY_CARE_PROVIDER_SITE_OTHER): Payer: Self-pay | Admitting: Orthopaedic Surgery

## 2018-07-03 ENCOUNTER — Ambulatory Visit (INDEPENDENT_AMBULATORY_CARE_PROVIDER_SITE_OTHER): Payer: Self-pay

## 2018-07-03 VITALS — Ht 75.5 in | Wt 162.9 lb

## 2018-07-03 DIAGNOSIS — S82861A Displaced Maisonneuve's fracture of right leg, initial encounter for closed fracture: Secondary | ICD-10-CM

## 2018-07-03 DIAGNOSIS — M25571 Pain in right ankle and joints of right foot: Secondary | ICD-10-CM

## 2018-07-03 NOTE — Progress Notes (Signed)
   Post-Op Visit Note   Patient: Caleb BaptistCraig Ricardo Frampton Jr.           Date of Birth: 07/29/1995           MRN: [id number] Visit Date: 07/03/2018 PCP: Patient, No Pcp Per   Assessment & Plan:  Chief Complaint:  Chief Complaint  Patient presents with  . Right Ankle - Follow-up   Visit Diagnoses:  1. Closed displaced Maisonneuve fracture, right, initial encounter   2. Pain in right ankle and joints of right foot     Plan: Patient is a pleasant 22 year old gentleman who presents to our clinic today 37 days status post ORIF right ankle syndesmosis fixation, date of surgery 05/27/2018.  He has been doing fairly well.  He has not been fully compliant with nonweightbearing.  He has been going up and down his stairs at home without crutches.  Minimal pain.  Examination of the right ankle shows well-healing surgical incisions without evidence of infection.  No swelling.  Calf is soft nontender.  He is neurovascularly intact distally.  At this point, we will allow the patient to transition to 25% weightbearing in his cam walker.  We will also start him in physical therapy for gentle range of motion and gait and stair training exercises.  He will follow-up with us in 5 weeks time when he is 10 weeks out from surgery.  At that point, we anticipate allowing him to be full weightbearing. We discussed today that we do not recommend driving until he has been fully weightbearing for 6 weeks.  We did provide him with a handicap placard today.  Follow-Up Instructions: Return in about 5 weeks (around 08/07/2018).   Orders:  Orders Placed This Encounter  Procedures  . XR Ankle 2 Views Right   No orders of the defined types were placed in this encounter.   Imaging: Xr Ankle 2 Views Right  Result Date: 07/03/2018 X-rays demonstrate stable fixation of the tight ropes with no widening of the mortise   PMFS History: Patient Active Problem List   Diagnosis Date Noted  . Pain in right ankle and joints of  right foot 07/03/2018  . Closed displaced Maisonneuve fracture, right, initial encounter 05/27/2018   Past Medical History:  Diagnosis Date  . Heart murmur     History reviewed. No pertinent family history.  Past Surgical History:  Procedure Laterality Date  . FEMUR SURGERY Left 12/2015  . ORIF ANKLE FRACTURE Right 05/27/2018   Procedure: OPEN REDUCTION INTERNAL FIXATION (ORIF) RIGHT ANKLE SYNDESMOTIC DISRUPTION;  Surgeon: Tarry KosXu, Naiping M, MD;  [facility];  Service: Orthopedics;  Laterality: Right;  . WISDOM TOOTH EXTRACTION Bilateral 2016   Social History   Occupational History  . Not on file  Tobacco Use  . Smoking status: Never Smoker  . Smokeless tobacco: Never Used  Substance and Sexual Activity  . Alcohol use: Not on file  . Drug use: Not on file  . Sexual activity: Not on file

## 2018-08-06 ENCOUNTER — Ambulatory Visit (INDEPENDENT_AMBULATORY_CARE_PROVIDER_SITE_OTHER): Payer: Self-pay

## 2018-08-06 ENCOUNTER — Encounter (INDEPENDENT_AMBULATORY_CARE_PROVIDER_SITE_OTHER): Payer: Self-pay | Admitting: Orthopaedic Surgery

## 2018-08-06 ENCOUNTER — Ambulatory Visit (INDEPENDENT_AMBULATORY_CARE_PROVIDER_SITE_OTHER): Payer: Self-pay | Admitting: Physician Assistant

## 2018-08-06 DIAGNOSIS — S82861A Displaced Maisonneuve's fracture of right leg, initial encounter for closed fracture: Secondary | ICD-10-CM

## 2018-08-06 NOTE — Progress Notes (Signed)
   Post-Op Visit Note   Patient: Caleb Matthews.           Date of Birth: Oct 27, 1995           MRN: [id number] Visit Date: 08/06/2018 PCP: Patient, No Pcp Per   Assessment & Plan:  Chief Complaint:  Chief Complaint  Patient presents with  . Right Ankle - Routine Post Op, Follow-up   Visit Diagnoses:  1. Closed displaced Maisonneuve fracture, right, initial encounter     Plan: Patient is a 23 year old gentleman who presents our clinic today 71 days status post ORIF right ankle symptoms basis disruption, date of surgery 05/27/2018.  He has been noncompliant with 25% weightbearing in a cam walker as he has been full weightbearing in a regular shoe for the past 10 days.  He has not attended any formal physical therapy which was suggested to him at his last visit.  The only pain he has is to the medial aspect with the pressure.  Examination of the right ankle reveals well-healed surgical incisions.  No evidence of infection.  No swelling.  Calf is soft and nontender.  He is neurovascular intact distally.  At this point, we will allow him to be full weightbearing in an ASO brace.  This was provided to him today.  I have written a new physical therapy prescription and have provided this to the patient.  I have reinforced to him that he needs to attend formal physical therapy.  He has not released to other activities at this point.  He will follow-up with [place] in 6 weeks time when he is 4 months out from surgery.  Call with concerns or questions in the meantime.  Follow-Up Instructions: Return in about 6 weeks (around 09/17/2018).   Orders:  Orders Placed This Encounter  Procedures  . XR Ankle Complete Right   No orders of the defined types were placed in this encounter.   Imaging: Xr Ankle Complete Right  Result Date: 08/06/2018 X-rays demonstrate stable alignment of the mortise   PMFS History: Patient Active Problem List   Diagnosis Date Noted  . Pain in right ankle and joints of  right foot 07/03/2018  . Closed displaced Maisonneuve fracture, right, initial encounter 05/27/2018   Past Medical History:  Diagnosis Date  . Heart murmur     No family history on file.  Past Surgical History:  Procedure Laterality Date  . FEMUR SURGERY Left 12/2015  . ORIF ANKLE FRACTURE Right 05/27/2018   Procedure: OPEN REDUCTION INTERNAL FIXATION (ORIF) RIGHT ANKLE SYNDESMOTIC DISRUPTION;  Surgeon: Tarry Kos, MD;  Location: [facility];  Service: Orthopedics;  Laterality: Right;  . WISDOM TOOTH EXTRACTION Bilateral 2016   Social History   Occupational History  . Not on file  Tobacco Use  . Smoking status: Never Smoker  . Smokeless tobacco: Never Used  Substance and Sexual Activity  . Alcohol use: Not on file  . Drug use: Not on file  . Sexual activity: Not on file

## 2018-09-22 ENCOUNTER — Ambulatory Visit (INDEPENDENT_AMBULATORY_CARE_PROVIDER_SITE_OTHER): Payer: Self-pay

## 2018-09-22 ENCOUNTER — Encounter (INDEPENDENT_AMBULATORY_CARE_PROVIDER_SITE_OTHER): Payer: Self-pay | Admitting: Orthopaedic Surgery

## 2018-09-22 ENCOUNTER — Ambulatory Visit (INDEPENDENT_AMBULATORY_CARE_PROVIDER_SITE_OTHER): Payer: Self-pay | Admitting: Orthopaedic Surgery

## 2018-09-22 DIAGNOSIS — M25571 Pain in right ankle and joints of right foot: Secondary | ICD-10-CM

## 2018-09-22 NOTE — Progress Notes (Signed)
   Post-Op Visit Note   Patient: Caleb Matthews.           Date of Birth: [birth year]           MRN: [id number] Visit Date: 09/22/2018 PCP: Patient, No Pcp Per   Assessment & Plan:  Chief Complaint:  Chief Complaint  Patient presents with  . Right Ankle - Pain   Visit Diagnoses:  1. Pain in right ankle and joints of right foot     Plan: Patient is a pleasant 23 year old gentleman who presents to our clinic today 118 days status post ORIF right ankle syndesmosis injury, date of surgery 05/27/2018.  He has been doing well.  He has no pain.  He does note a feeling of a "knot "to the medial aspect of his ankle.  This does not cause much more than occasional discomfort.  He has finished formal physical therapy where he has regained full motion and strength.  Examination of his right ankle reveals full range of motion and strength.  No tenderness.  He is neurovascular intact distally.  Full range of motion and strength.  At this point, his injury has healed and he can advance with activity as tolerated.  Follow-up with [place] as needed.  Follow-Up Instructions: Return if symptoms worsen or fail to improve.   Orders:  Orders Placed This Encounter  Procedures  . XR Ankle Complete Right   No orders of the defined types were placed in this encounter.   Imaging: Xr Ankle Complete Right  Result Date: 09/22/2018 X-rays demonstrate no widening of the syndesmosis   PMFS History: Patient Active Problem List   Diagnosis Date Noted  . Pain in right ankle and joints of right foot 07/03/2018  . Closed displaced Maisonneuve fracture, right, initial encounter 05/27/2018   Past Medical History:  Diagnosis Date  . Heart murmur     History reviewed. No pertinent family history.  Past Surgical History:  Procedure Laterality Date  . FEMUR SURGERY Left 12/2015  . ORIF ANKLE FRACTURE Right 05/27/2018   Procedure: OPEN REDUCTION INTERNAL FIXATION (ORIF) RIGHT ANKLE SYNDESMOTIC DISRUPTION;   Surgeon: Tarry Kos, MD;  Location: [facility];  Service: Orthopedics;  Laterality: Right;  . WISDOM TOOTH EXTRACTION Bilateral 2016   Social History   Occupational History  . Not on file  Tobacco Use  . Smoking status: Never Smoker  . Smokeless tobacco: Never Used  Substance and Sexual Activity  . Alcohol use: Not on file  . Drug use: Not on file  . Sexual activity: Not on file
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
## 2019-08-21 IMAGING — RF DG C-ARM 61-120 MIN
1 series · 2 of 2 positions shown · non-contrast
Comparison: 05/14/2018

FLUOROSCOPY TIME:  0 minutes 18 seconds

Images submitted: 2

CLINICAL DATA: ORIF syndesmotic disruption RIGHT ankle

EXAM:
RIGHT ANKLE - 2 VIEW; DG C-ARM 61-120 MIN

[Series 1: run · 2 of 2 slices shown]
[im 1/2]
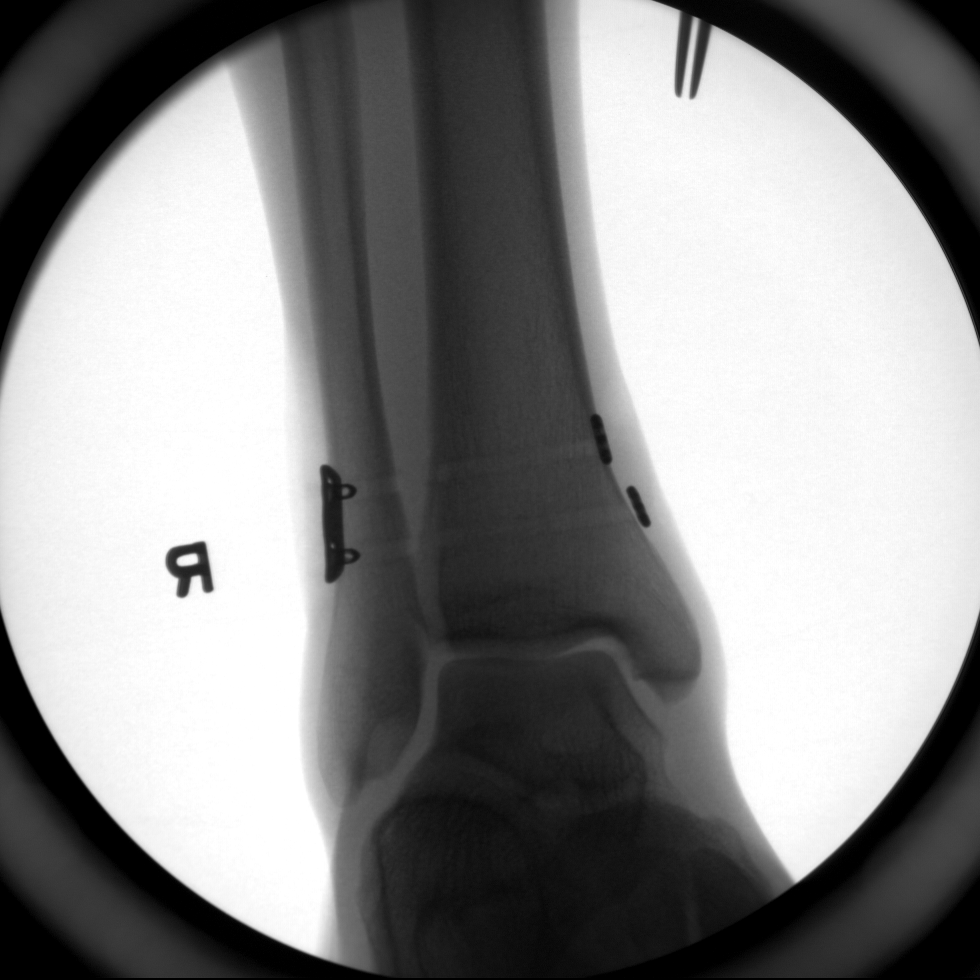
[im 2/2]
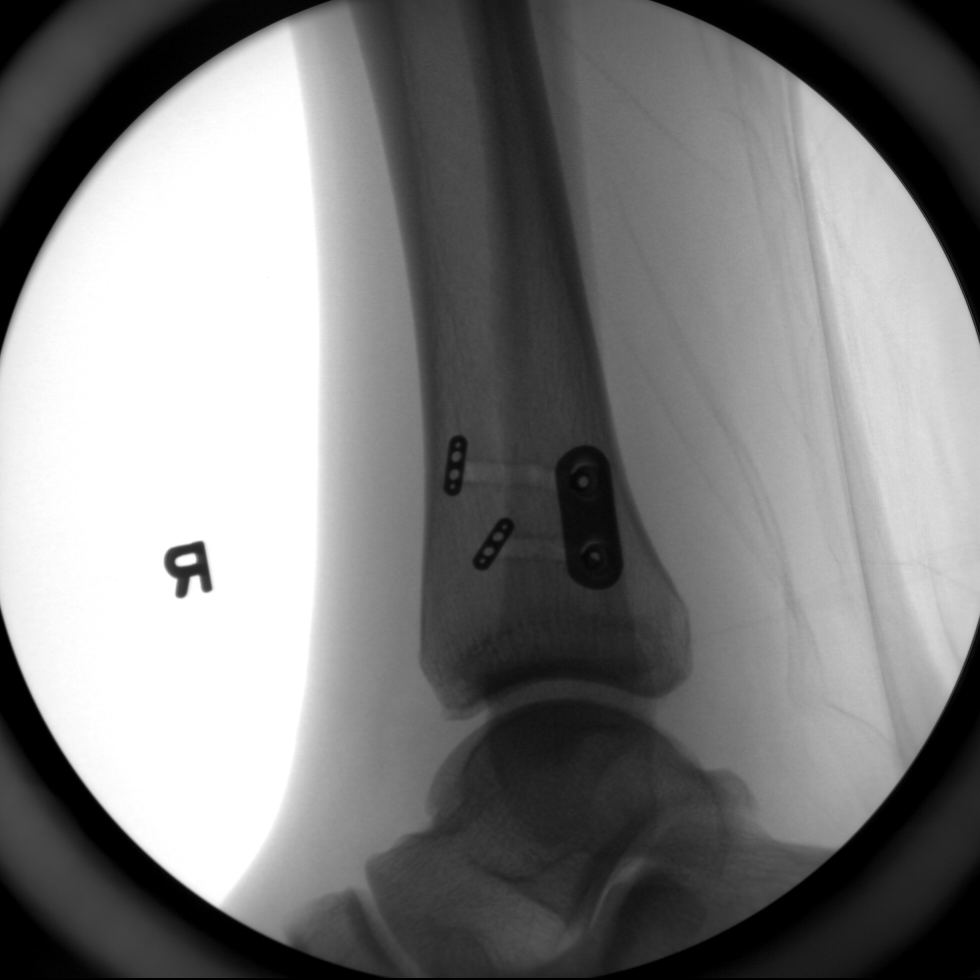

[2 of 2 positions shown; findings below may reference images not displayed]

FINDINGS: Suture anchors of are identified at the lateral aspect of the distal
fibula and the medial aspect of the distal tibia with suture tunnels
through both bones consistent with syndesmotic stabilization.

Ankle joint space preserved.

Osseous mineralization normal.

No fractures identified.
IMPRESSION: Syndesmotic stabilization procedure RIGHT ankle.

## 2020-12-14 ENCOUNTER — Emergency Department (HOSPITAL_COMMUNITY)
Admission: EM | Admit: 2020-12-14 | Discharge: 2020-12-14 | Disposition: A | Discharge status: Home / Self Care | Payer: Self-pay | Attending: Emergency Medicine | Admitting: Emergency Medicine

## 2020-12-14 ENCOUNTER — Encounter (HOSPITAL_COMMUNITY): Payer: Self-pay | Admitting: Emergency Medicine

## 2020-12-14 DIAGNOSIS — R519 Headache, unspecified: Secondary | ICD-10-CM

## 2020-12-14 DIAGNOSIS — Z7982 Long term (current) use of aspirin: Secondary | ICD-10-CM | POA: Insufficient documentation

## 2020-12-14 DIAGNOSIS — U071 COVID-19: Secondary | ICD-10-CM | POA: Insufficient documentation

## 2020-12-14 DIAGNOSIS — J45909 Unspecified asthma, uncomplicated: Secondary | ICD-10-CM | POA: Insufficient documentation

## 2020-12-14 DIAGNOSIS — R42 Dizziness and giddiness: Secondary | ICD-10-CM | POA: Insufficient documentation

## 2020-12-14 HISTORY — DX: Unspecified asthma, uncomplicated: J45.909

## 2020-12-14 LAB — COMPREHENSIVE METABOLIC PANEL
ALT: 26 U/L (ref 0–44)
AST: 25 U/L (ref 15–41)
Albumin: 4.6 g/dL (ref 3.5–5.0)
Alkaline Phosphatase: 69 U/L (ref 38–126)
Anion gap: 12 (ref 5–15)
BUN: 8 mg/dL (ref 6–20)
CO2: 25 mmol/L (ref 22–32)
Calcium: 9.2 mg/dL (ref 8.9–10.3)
Chloride: 95 mmol/L — ABNORMAL LOW (ref 98–111)
Creatinine, Ser: 1.1 mg/dL (ref 0.61–1.24)
GFR, Estimated: >60 mL/min (ref >60)
Glucose, Bld: 101 mg/dL — ABNORMAL HIGH (ref 70–99)
Potassium: 3.6 mmol/L (ref 3.5–5.1)
Sodium: 132 mmol/L — ABNORMAL LOW (ref 135–145)
Total Bilirubin: 1 mg/dL (ref 0.3–1.2)
Total Protein: 7.5 g/dL (ref 6.5–8.1)

## 2020-12-14 LAB — URINALYSIS, ROUTINE W REFLEX MICROSCOPIC
Bilirubin Urine: NEGATIVE
Glucose, UA: NEGATIVE mg/dL
Hgb urine dipstick: NEGATIVE
Ketones, ur: 80 mg/dL — AB
Leukocytes,Ua: NEGATIVE
Nitrite: NEGATIVE
Protein, ur: NEGATIVE mg/dL
Specific Gravity, Urine: 1.029 (ref 1.005–1.030)
pH: 6 (ref 5.0–8.0)

## 2020-12-14 LAB — CBC
HCT: 42.6 % (ref 39.0–52.0)
Hemoglobin: 14.2 g/dL (ref 13.0–17.0)
MCH: 29.6 pg (ref 26.0–34.0)
MCHC: 33.3 g/dL (ref 30.0–36.0)
MCV: 88.8 fL (ref 80.0–100.0)
Platelets: 239 10*3/uL (ref 150–400)
RBC: 4.8 MIL/uL (ref 4.22–5.81)
RDW: 11.6 % (ref 11.5–15.5)
WBC: 4.3 10*3/uL (ref 4.0–10.5)
nRBC: 0 % (ref 0.0–0.2)

## 2020-12-14 LAB — LIPASE, BLOOD: Lipase: 31 U/L (ref 11–51)

## 2020-12-14 LAB — RESP PANEL BY RT-PCR (FLU A&B, COVID) ARPGX2
Influenza A by PCR: NEGATIVE
Influenza B by PCR: NEGATIVE
SARS Coronavirus 2 by RT PCR: POSITIVE — AB

## 2020-12-14 LAB — CBG MONITORING, ED: Glucose-Capillary: 112 mg/dL — ABNORMAL HIGH (ref 70–99)

## 2020-12-14 MED ORDER — LACTATED RINGERS IV BOLUS
1000.0000 mL | Freq: Once | INTRAVENOUS | Status: AC
  Administered 2020-12-14: 1000 mL via INTRAVENOUS

## 2020-12-14 MED ORDER — KETOROLAC TROMETHAMINE 15 MG/ML IJ SOLN
15.0000 mg | Freq: Once | INTRAMUSCULAR | Status: AC
  Administered 2020-12-14: 15 mg via INTRAVENOUS
  Filled 2020-12-14: qty 1

## 2020-12-14 NOTE — ED Triage Notes (Signed)
Patient complains of feeling lightheaded, dizzy, loss of appetite, and a headache for the last two days. Patient alert, oriented, ambulatory with steady gait, and in no apparent distress at this time.

## 2020-12-14 NOTE — Discharge Instructions (Signed)
Headache Your lab results showed no acute abnormalities.  For future headaches please try the following regimen: Antiinflammatory medications: Take 600 mg of ibuprofen every 6 hours or 440 mg (over the counter dose) to 500 mg (prescription dose) of naproxen every 12 hours.  Use this regimen until headache subsides for up to 3 days .  After this time, these medications may be used as needed for pain. Take these medications with food to avoid upset stomach. Choose only one of these medications, do not take them together. Acetaminophen: Should you continue to have additional pain while taking the ibuprofen or naproxen, you may add in acetaminophen (generic for Tylenol) as needed. Your daily total maximum amount of acetaminophen from all sources should be limited to 4000mg /day for persons without liver problems, or 2000mg /day for those with liver problems.  Hydration: Have a goal of about a half liter of water every couple hours to stay well hydrated.   Sleep: Please be sure to get plenty of sleep with a goal of 8 hours per night. Having a regular bed time and bedtime routine can help with this.  Screens: Reduce the amount of time you are in front of screens.  Take about a 5-10-minute break every hour or every couple hours to give your eyes rest.  Do not use screens in dark rooms.  Glasses with a blue light filter may also help reduce eye fatigue.  Stress: Take steps to reduce stress as much as possible.   Follow up: Follow-up with your primary care provider on this issue.  May also need to follow-up with the neurologist for increased frequency of headaches.  Test Results for COVID-19 pending  You have a test pending for COVID-19.  Results typically return within about 48 hours.  Be sure to check MyChart for updated results.  We recommend isolating yourself until results are received.  Patients who have symptoms consistent with COVID-19 should self isolated for: At least 3 days (72 hours) have  passed since recovery, defined as resolution of fever without the use of fever reducing medications and improvement in respiratory symptoms (e.g., cough, shortness of breath), and At least 7 days have passed since symptoms first appeared.  If you have no symptoms, but your test returns positive, recommend isolating for at least 10 days.

## 2020-12-14 NOTE — ED Provider Notes (Signed)
[name] EMERGENCY DEPARTMENT Provider Note   CSN: [id number] Arrival date & time: 12/14/20  0913     History Chief Complaint  Patient presents with  . Dizziness    Caleb Matthews. is a 25 y.o. male.  HPI      Caleb Matthews. is a 25 y.o. male, with a history of asthma, presenting to the ED with intermittent lightheadedness for the last 2 days. Also complains of intermittent headache, chills, fatigue. Description of headache is vague, generalized, seemingly mild, nonradiating.  Denies measured fever, neck pain/stiffness, neurologic deficits, chest pain, shortness of breath, cough, abdominal pain, N/V/D, or any other complaints.   Past Medical History:  Diagnosis Date  . Asthma   . Heart murmur     Patient Active Problem List   Diagnosis Date Noted  . Pain in right ankle and joints of right foot 07/03/2018  . Closed displaced Maisonneuve fracture, right, initial encounter 05/27/2018    Past Surgical History:  Procedure Laterality Date  . FEMUR SURGERY Left 12/2015  . ORIF ANKLE FRACTURE Right 05/27/2018   Procedure: OPEN REDUCTION INTERNAL FIXATION (ORIF) RIGHT ANKLE SYNDESMOTIC DISRUPTION;  Surgeon: Tarry Kos, MD;  Location: [facility];  Service: Orthopedics;  Laterality: Right;  . WISDOM TOOTH EXTRACTION Bilateral 2016       No family history on file.  Social History   Tobacco Use  . Smoking status: Never Smoker  . Smokeless tobacco: Never Used  Vaping Use  . Vaping Use: Some days  . Devices: various    Home Medications Prior to Admission medications   Medication Sig Start Date End Date Taking? Authorizing Provider  aspirin EC 81 MG tablet Take 1 tablet (81 mg total) by mouth 2 (two) times daily. Patient not taking: Reported on 08/06/2018 05/27/18   Tarry Kos, MD  HYDROcodone-acetaminophen (NORCO/VICODIN) 5-325 MG tablet Take 1 tablet by mouth every 4 (four) hours as needed. Patient not taking:  Reported on 08/06/2018 05/20/18   Kirtland Bouchard, PA-C  ibuprofen (ADVIL,MOTRIN) 200 MG tablet Take 200 mg by mouth every 6 (six) hours as needed for headache or moderate pain.    [provider]  methocarbamol (ROBAXIN) 750 MG tablet Take 1 tablet (750 mg total) by mouth 2 (two) times daily as needed for muscle spasms. Patient not taking: Reported on 08/06/2018 05/27/18   Tarry Kos, MD  naproxen (NAPROSYN) 500 MG tablet Take 1 tablet (500 mg total) by mouth 2 (two) times daily with a meal. Patient not taking: Reported on 08/06/2018 05/29/18   Tarry Kos, MD  ondansetron (ZOFRAN) 4 MG tablet Take 1-2 tablets (4-8 mg total) by mouth every 8 (eight) hours as needed for nausea or vomiting. Patient not taking: Reported on 08/06/2018 05/27/18   Tarry Kos, MD  oxyCODONE-acetaminophen (PERCOCET) 5-325 MG tablet Take 1-2 tablets by mouth every 4 (four) hours as needed for severe pain. Patient not taking: Reported on 08/06/2018 05/27/18   Tarry Kos, MD  promethazine (PHENERGAN) 25 MG tablet Take 1 tablet (25 mg total) by mouth every 6 (six) hours as needed for nausea. Patient not taking: Reported on 08/06/2018 05/27/18   Tarry Kos, MD  senna-docusate (SENOKOT S) 8.6-50 MG tablet Take 1 tablet by mouth at bedtime as needed. Patient not taking: Reported on 08/06/2018 05/27/18   Tarry Kos, MD    Allergies    Peanut butter flavor and Penicillins  Review of Systems  Review of Systems  Constitutional: Positive for chills and fatigue.  Eyes: Negative for visual disturbance.  Respiratory: Negative for cough and shortness of breath.   Cardiovascular: Negative for chest pain.  Gastrointestinal: Negative for abdominal pain, diarrhea, nausea and vomiting.  Musculoskeletal: Positive for myalgias. Negative for back pain and neck pain.  Neurological: Positive for light-headedness and headaches. Negative for syncope, weakness and numbness.  Psychiatric/Behavioral: Negative for  confusion.  All other systems reviewed and are negative.   Physical Exam Updated Vital Signs BP 133/83   Pulse 80   Temp 100.3 F (37.9 C) (Oral)   Resp 16   SpO2 100%   Physical Exam Vitals and nursing note reviewed.  Constitutional:      General: He is not in acute distress.    Appearance: He is well-developed. He is not diaphoretic.  HENT:     Head: Normocephalic and atraumatic.     Nose: Mucosal edema and congestion present.     Mouth/Throat:     Mouth: Mucous membranes are moist.     Pharynx: Oropharynx is clear.  Eyes:     Conjunctiva/sclera: Conjunctivae normal.  Neck:     Meningeal: Brudzinski's sign and Kernig's sign absent.  Cardiovascular:     Rate and Rhythm: Normal rate and regular rhythm.     Pulses: Normal pulses.          Radial pulses are 2+ on the right side and 2+ on the left side.       Posterior tibial pulses are 2+ on the right side and 2+ on the left side.     Heart sounds: Normal heart sounds.     Comments: Tactile temperature in the extremities appropriate and equal bilaterally. Pulmonary:     Effort: Pulmonary effort is normal. No respiratory distress.     Breath sounds: Normal breath sounds.  Abdominal:     Palpations: Abdomen is soft.     Tenderness: There is no abdominal tenderness. There is no guarding.  Musculoskeletal:     Cervical back: Normal range of motion and neck supple. No rigidity or tenderness.     Right lower leg: No edema.     Left lower leg: No edema.  Lymphadenopathy:     Cervical: No cervical adenopathy.  Skin:    General: Skin is warm and dry.  Neurological:     Mental Status: He is alert and oriented to person, place, and time.     Comments: No noted acute cognitive deficit. Sensation grossly intact to light touch in the extremities.   Grip strengths equal bilaterally.   Strength 5/5 in all extremities.  No gait disturbance.  Coordination intact.  Cranial nerves III-XII grossly intact.  Handles oral secretions  without noted difficulty.  No noted phonation or speech deficit. No facial droop.   Psychiatric:        Mood and Affect: Mood and affect normal.        Speech: Speech normal.        Behavior: Behavior normal.     ED Results / Procedures / Treatments   Labs (all labs ordered are listed, but only abnormal results are displayed) Labs Reviewed  RESP PANEL BY RT-PCR (FLU A&B, COVID) ARPGX2 - Abnormal; Notable for the following components:      Result Value   SARS Coronavirus 2 by RT PCR POSITIVE (*)    All other components within normal limits  COMPREHENSIVE METABOLIC PANEL - Abnormal; Notable for the following components:   Sodium  132 (*)    Chloride 95 (*)    Glucose, Bld 101 (*)    All other components within normal limits  URINALYSIS, ROUTINE W REFLEX MICROSCOPIC - Abnormal; Notable for the following components:   APPearance HAZY (*)    Ketones, ur 80 (*)    All other components within normal limits  CBG MONITORING, ED - Abnormal; Notable for the following components:   Glucose-Capillary 112 (*)    All other components within normal limits  LIPASE, BLOOD  CBC    EKG None  Radiology No results found.  Procedures Procedures   Medications Ordered in ED Medications  ketorolac (TORADOL) 15 MG/ML injection 15 mg (15 mg Intravenous Given 12/14/20 1009)  lactated ringers bolus 1,000 mL (0 mLs Intravenous Stopped 12/14/20 1146)    ED Course  I have reviewed the triage vital signs and the nursing notes.  Pertinent labs & imaging results that were available during my care of the patient were reviewed by me and considered in my medical decision making (see chart for details).    MDM Rules/Calculators/A&P                          Patient presents with fever, chills, body aches, fatigue, headache. I highly suspect a viral origin for the patient's symptoms. Patient is nontoxic appearing, afebrile, not tachycardic, not tachypneic, not hypotensive, maintains excellent SPO2 on  room air, and is in no apparent distress.   I have reviewed the patient's chart to obtain more information.   I reviewed and interpreted the patient's labs and radiological studies. Alternative diagnoses, such as meningitis, considered, but thought less likely due to duration of the patient's symptoms while still presenting as stable upon his arrival in the ED and the physical exam is not suggestive. No leukocytosis. Patient's symptoms resolved with Toradol and IV fluids. COVID test pending at time of discharge, though it is highly likely this will return positive. Patient and his mother at the bedside were given instructions for home care as well as return precautions.  Both parties voice understanding of these instructions, accept the plan, and are comfortable with discharge.  Vitals:   12/14/20 0920 12/14/20 1145 12/14/20 1203  BP: 133/83 124/66   Pulse: 80 62   Resp: 16 18   Temp: 100.3 F (37.9 C)  98.9 F (37.2 C)  TempSrc: Oral  Oral  SpO2: 100% 96%     Final Clinical Impression(s) / ED Diagnoses Final diagnoses:  Lightheadedness  Generalized headache    Rx / DC Orders ED Discharge Orders    None       Caleb Matthews 12/14/20 1347    Caleb Grizzle, MD 12/18/20 [phone]

## 2020-12-15 ENCOUNTER — Telehealth: Payer: Self-pay

## 2020-12-15 NOTE — Telephone Encounter (Signed)
Called to discuss with patient about COVID-19 symptoms and the use of one of the available treatments for those with mild to moderate Covid symptoms and at a high risk of hospitalization.  Pt appears to qualify for outpatient treatment due to co-morbid conditions and/or a member of an at-risk group in accordance with the FDA Emergency Use Authorization.    Symptom onset: 12/14/20 headache,dizziness,chills Vaccinated: No Booster? No Immunocompromised? No Qualifiers: Asthma NIH Criteria: No risk  Declines further treatment.   Caleb Matthews
